# Patient Record
Sex: Female | Born: 1970 | Race: Black or African American | Hispanic: No | Marital: Single
Health system: Southern US, Community
[De-identification: ages and names within clinical notes are randomized; demographics above are authoritative.]

## PROBLEM LIST (undated history)

## (undated) DIAGNOSIS — I10 Essential (primary) hypertension: Secondary | ICD-10-CM

---

## 2017-05-01 ENCOUNTER — Emergency Department: Payer: Self-pay

## 2017-05-01 ENCOUNTER — Encounter: Payer: Self-pay | Admitting: Emergency Medicine

## 2017-05-01 ENCOUNTER — Emergency Department
Admission: EM | Admit: 2017-05-01 | Discharge: 2017-05-01 | Disposition: A | Discharge status: Home / Self Care | Payer: Self-pay | Attending: Emergency Medicine | Admitting: Emergency Medicine

## 2017-05-01 DIAGNOSIS — I10 Essential (primary) hypertension: Secondary | ICD-10-CM | POA: Insufficient documentation

## 2017-05-01 DIAGNOSIS — R0602 Shortness of breath: Secondary | ICD-10-CM | POA: Insufficient documentation

## 2017-05-01 DIAGNOSIS — D509 Iron deficiency anemia, unspecified: Secondary | ICD-10-CM | POA: Insufficient documentation

## 2017-05-01 DIAGNOSIS — R140 Abdominal distension (gaseous): Secondary | ICD-10-CM

## 2017-05-01 DIAGNOSIS — K219 Gastro-esophageal reflux disease without esophagitis: Secondary | ICD-10-CM | POA: Insufficient documentation

## 2017-05-01 HISTORY — DX: Essential (primary) hypertension: I10

## 2017-05-01 LAB — PREGNANCY, URINE: PREG TEST UR: NEGATIVE

## 2017-05-01 LAB — BASIC METABOLIC PANEL
Anion gap: 9 (ref 5–15)
BUN: 11 mg/dL (ref 6–20)
CALCIUM: 9.4 mg/dL (ref 8.9–10.3)
CO2: 23 mmol/L (ref 22–32)
CREATININE: 0.94 mg/dL (ref 0.44–1.00)
Chloride: 103 mmol/L (ref 101–111)
GFR calc Af Amer: >60 mL/min (ref >60)
Glucose, Bld: 133 mg/dL — ABNORMAL HIGH (ref 65–99)
Potassium: 3.7 mmol/L (ref 3.5–5.1)
Sodium: 135 mmol/L (ref 135–145)

## 2017-05-01 LAB — CBC
HCT: 27 % — ABNORMAL LOW (ref 35.0–47.0)
Hemoglobin: 8.6 g/dL — ABNORMAL LOW (ref 12.0–16.0)
MCH: 20.2 pg — ABNORMAL LOW (ref 26.0–34.0)
MCHC: 31.8 g/dL — ABNORMAL LOW (ref 32.0–36.0)
MCV: 63.6 fL — ABNORMAL LOW (ref 80.0–100.0)
PLATELETS: 372 10*3/uL (ref 150–440)
RBC: 4.25 MIL/uL (ref 3.80–5.20)
RDW: 20.9 % — AB (ref 11.5–14.5)
WBC: 8 10*3/uL (ref 3.6–11.0)

## 2017-05-01 LAB — TROPONIN I: Troponin I: <0.03 ng/mL (ref <0.03)

## 2017-05-01 MED ORDER — FAMOTIDINE 20 MG PO TABS
40.0000 mg | ORAL_TABLET | Freq: Once | ORAL | Status: AC
  Administered 2017-05-01: 40 mg via ORAL
  Filled 2017-05-01: qty 2

## 2017-05-01 MED ORDER — GI COCKTAIL ~~LOC~~
30.0000 mL | ORAL | Status: AC
  Administered 2017-05-01: 30 mL via ORAL
  Filled 2017-05-01: qty 30

## 2017-05-01 MED ORDER — ALUMINUM-MAGNESIUM-SIMETHICONE 200-200-20 MG/5ML PO SUSP: 30.0000 mL | Freq: Three times a day (TID) | ORAL | 0 refills | Status: AC

## 2017-05-01 MED ORDER — FERROUS SULFATE 325 (65 FE) MG PO TABS: 325.0000 mg | ORAL_TABLET | Freq: Every day | ORAL | 3 refills | Status: AC

## 2017-05-01 MED ORDER — LISINOPRIL-HYDROCHLOROTHIAZIDE 20-12.5 MG PO TABS: 1.0000 | ORAL_TABLET | Freq: Every day | ORAL | 1 refills | Status: DC

## 2017-05-01 MED ORDER — FAMOTIDINE 20 MG PO TABS: 20.0000 mg | ORAL_TABLET | Freq: Two times a day (BID) | ORAL | 0 refills | Status: AC

## 2017-05-01 MED ORDER — LORATADINE 10 MG PO TABS: 10.0000 mg | ORAL_TABLET | Freq: Every day | ORAL | 2 refills | Status: AC

## 2017-05-01 NOTE — ED Notes (Signed)
Patient transported to X-ray 

## 2017-05-01 NOTE — ED Notes (Signed)
Pt states she had immediate relief from the sensation in her throat and stomach.

## 2017-05-01 NOTE — ED Triage Notes (Signed)
Pt reports abdominal bloating, shortness of breath, tightness in upper chest area and palpitations. Pt ambulatory to triage, no resp distress noted.

## 2017-05-01 NOTE — ED Notes (Signed)
Urine sent without completing POC urine, lab notified.

## 2017-05-01 NOTE — ED Provider Notes (Signed)
[facility] Emergency Department Provider Note  ____________________________________________  Time seen: Approximately 1:01 PM  I have reviewed the triage vital signs and the nursing notes.   HISTORY  Chief Complaint Shortness of Breath; Chest Pain; and Bloated    HPI Sierra Steele is a 47 y.o. female who complains of a bloating feeling for the past week with epigastric pain. Worse when lying flat at night. She wakes up in the middle of night often having to cough. Associated with shortness of breath. She also notes having a postnasal drip that started after recently moving to this region. Denies chest pain, denies any significant shortness of breath. No vomiting. No fever chills sweats or radiating pain. Symptoms are mild to moderate intensity. No alleviating factors.      Past Medical History:  Diagnosis Date  . Hypertension      There are no active problems to display for this patient.    History reviewed. No pertinent surgical history.   Prior to Admission medications   Medication Sig Start Date End Date Taking? Authorizing Provider  aluminum-magnesium hydroxide-simethicone (MAALOX) 200-200-20 MG/5ML SUSP Take 30 mLs by mouth 4 (four) times daily -  before meals and at bedtime. 05/01/17   Sharman Cheek, MD  famotidine (PEPCID) 20 MG tablet Take 1 tablet (20 mg total) by mouth 2 (two) times daily. 05/01/17   Sharman Cheek, MD  ferrous sulfate 325 (65 FE) MG tablet Take 1 tablet (325 mg total) by mouth daily. 05/01/17 05/01/18  Sharman Cheek, MD  lisinopril-hydrochlorothiazide (ZESTORETIC) 20-12.5 MG tablet Take 1 tablet by mouth daily. 05/01/17 05/01/18  Sharman Cheek, MD  loratadine (CLARITIN) 10 MG tablet Take 1 tablet (10 mg total) by mouth daily. 05/01/17 05/01/18  Sharman Cheek, MD     Allergies Patient has no known allergies.   No family history on file.  Social History Social History   Tobacco Use  . Smoking status:  Not on file  Substance Use Topics  . Alcohol use: Not on file  . Drug use: Not on file    Review of Systems  Constitutional:   No fever or chills.  ENT:   No sore throat. Positive rhinorrhea. Cardiovascular:   No chest pain or syncope. Respiratory:   No dyspnea or cough. Gastrointestinal:  positive as above for upper abdominal pain without.  Musculoskeletal:   Negative for focal pain or swelling All other systems reviewed and are negative except as documented above in ROS and HPI.  ____________________________________________   PHYSICAL EXAM:  VITAL SIGNS: ED Triage Vitals  Enc Vitals Group     BP 05/01/17 0841 (!) 175/100     Pulse Rate 05/01/17 0841 (!) 105     Resp 05/01/17 0841 17     Temp 05/01/17 0841 98.3 F (36.8 C)     Temp Source 05/01/17 0841 Oral     SpO2 05/01/17 0841 100 %     Weight 05/01/17 0842 196 lb (88.9 kg)     Height 05/01/17 0842 5' (1.524 m)     Head Circumference --      Peak Flow --      Pain Score 05/01/17 0842 0     Pain Loc --      Pain Edu? --      Excl. in GC? --     Vital signs reviewed, nursing assessments reviewed.   Constitutional:   Alert and oriented. Well appearing and in no distress. Eyes:   Conjunctivae are normal. EOMI. PERRL. ENT  Head:   Normocephalic and atraumatic.      Nose:   No congestion/rhinnorhea.       Mouth/Throat:   MMM, mild pharyngeal erythema. No peritonsillar mass.       Neck:   No meningismus. Full ROM.no JVD Hematological/Lymphatic/Immunilogical:   No cervical lymphadenopathy. Cardiovascular:   RRR rate of 80. Symmetric bilateral radial and DP pulses.  No murmurs.  Respiratory:   Normal respiratory effort without tachypnea/retractions. Breath sounds are clear and equal bilaterally. No wheezes/rales/rhonchi. Gastrointestinal:   Soft and nontender. Non distended. There is no CVA tenderness.  No rebound, rigidity, or guarding. Genitourinary:   deferred Musculoskeletal:   Normal range of motion in all  extremities. No joint effusions.  No lower extremity tenderness.  No edema. Neurologic:   Normal speech and language.  Motor grossly intact. No acute focal neurologic deficits are appreciated.  Skin:    Skin is warm, dry and intact. No rash noted.  No petechiae, purpura, or bullae.  ____________________________________________    LABS (pertinent positives/negatives) (all labs ordered are listed, but only abnormal results are displayed) Labs Reviewed  BASIC METABOLIC PANEL - Abnormal; Notable for the following components:      Result Value   Glucose, Bld 133 (*)    All other components within normal limits  CBC - Abnormal; Notable for the following components:   Hemoglobin 8.6 (*)    HCT 27.0 (*)    MCV 63.6 (*)    MCH 20.2 (*)    MCHC 31.8 (*)    RDW 20.9 (*)    All other components within normal limits  TROPONIN I  PREGNANCY, URINE  POC URINE PREG, ED   ____________________________________________   EKG  interpreted by me Sinus tachycardia rate 105, right axis, normal intervals. Normal QRS ST segments.  ____________________________________________    RADIOLOGY  Dg Chest 2 View  Result Date: 05/01/2017 CLINICAL DATA:  Abdominal bloating, shortness of breath, upper chest tightness, and palpitations intermittently for several weeks. Symptoms were worse this morning. No respiratory distress. EXAM: CHEST - 2 VIEW COMPARISON:  None. FINDINGS: The lungs are adequately inflated. The interstitial markings are mildly prominent. The heart is normal in size. The pulmonary vascularity is not engorged. There is no pleural effusion. The bony thorax exhibits no acute abnormality. IMPRESSION: Mild interstitial prominence may reflect bronchitic changes or smoking related changes if there is a history of smoking. There is no alveolar pneumonia nor CHF. Electronically Signed   By: David  Swazil[place] M.D.   On: 05/01/2017 08:57     ____________________________________________   PROCEDURES Procedures  ____________________________________________  DIFFERENTIAL DIAGNOSIS   Gerd, gastritis, seasonal allergies.  CLINICAL IMPRESSION / ASSESSMENT AND PLAN / ED COURSE  Pertinent labs & imaging results that were available during my care of the patient were reviewed by me and considered in my medical decision making (see chart for details).    patient well-appearing no acute distress, presents with abdominal bloating and symptoms consistent with GERD or postnasal drip. No significant shortness of breath except for when she is having is aspiration related to the other symptoms. No chest pain.Considering the patient's symptoms, medical history, and physical examination today, I have low suspicion for ACS, PE, TAD, pneumothorax, carditis, mediastinitis, pneumonia, CHF, or sepsis.    Clinical Course as of May 01 1301  Tue May 01, 2017  0944 Hb 9.2 12/2016. CBC c/w iron deficiency anemia.   Hemoglobin(!): 8.6 [PS]    Clinical Course User Index [PS] Sharman CheekStafford, Millicent Blazejewski, MD     -----------------------------------------  1:03 PM on 05/01/2017 -----------------------------------------  Continues feeling better. Vital signs unremarkable except for hypertension. I will refill the patient's lisinopril and he was has run out of. Stable for discharge home.  ____________________________________________   FINAL CLINICAL IMPRESSION(S) / ED DIAGNOSES    Final diagnoses:  Gastroesophageal reflux disease, esophagitis presence not specified  Bloating symptom  Iron deficiency anemia, unspecified iron deficiency anemia type     ED Discharge Orders        Ordered    lisinopril-hydrochlorothiazide (ZESTORETIC) 20-12.5 MG tablet  Daily     05/01/17 1301    aluminum-magnesium hydroxide-simethicone (MAALOX) 200-200-20 MG/5ML SUSP  3 times daily before meals & bedtime     05/01/17 1301    famotidine (PEPCID) 20 MG tablet   2 times daily     05/01/17 1301    loratadine (CLARITIN) 10 MG tablet  Daily     05/01/17 1301    ferrous sulfate 325 (65 FE) MG tablet  Daily     05/01/17 1303      Portions of this note were generated with dragon dictation software. Dictation errors may occur despite best attempts at proofreading.    Sharman Cheek, MD 05/01/17 [phone]

## 2017-05-01 NOTE — ED Notes (Signed)
[place] here for work, states for the past week she has had a nonproductive cough with "heart pain" and post nasal drip. Pt also c/o epigastric pain with bloating, states she feels like she is full of gas and needs to belch all the time. Pt is in NAD respirations WNL.. Skin is warm and dry..Marland Kitchen

## 2018-07-14 ENCOUNTER — Emergency Department (HOSPITAL_COMMUNITY): Payer: Self-pay

## 2018-07-14 ENCOUNTER — Emergency Department (HOSPITAL_COMMUNITY)
Admission: EM | Admit: 2018-07-14 | Discharge: 2018-07-14 | Disposition: A | Discharge status: Home / Self Care | Payer: Self-pay | Attending: Emergency Medicine | Admitting: Emergency Medicine

## 2018-07-14 ENCOUNTER — Encounter (HOSPITAL_COMMUNITY): Payer: Self-pay | Admitting: Emergency Medicine

## 2018-07-14 ENCOUNTER — Other Ambulatory Visit: Payer: Self-pay

## 2018-07-14 DIAGNOSIS — R0602 Shortness of breath: Secondary | ICD-10-CM | POA: Insufficient documentation

## 2018-07-14 DIAGNOSIS — Z79899 Other long term (current) drug therapy: Secondary | ICD-10-CM | POA: Insufficient documentation

## 2018-07-14 DIAGNOSIS — R609 Edema, unspecified: Secondary | ICD-10-CM | POA: Insufficient documentation

## 2018-07-14 DIAGNOSIS — R140 Abdominal distension (gaseous): Secondary | ICD-10-CM | POA: Insufficient documentation

## 2018-07-14 DIAGNOSIS — Z9114 Patient's other noncompliance with medication regimen: Secondary | ICD-10-CM | POA: Insufficient documentation

## 2018-07-14 DIAGNOSIS — I10 Essential (primary) hypertension: Secondary | ICD-10-CM | POA: Insufficient documentation

## 2018-07-14 DIAGNOSIS — F17210 Nicotine dependence, cigarettes, uncomplicated: Secondary | ICD-10-CM | POA: Insufficient documentation

## 2018-07-14 LAB — CBC WITH DIFFERENTIAL/PLATELET
Abs Immature Granulocytes: 0 10*3/uL (ref 0.00–0.07)
Basophils Absolute: 0 10*3/uL (ref 0.0–0.1)
Basophils Relative: 0 %
Eosinophils Absolute: 0.4 10*3/uL (ref 0.0–0.5)
Eosinophils Relative: 5 %
HCT: 28.5 % — ABNORMAL LOW (ref 36.0–46.0)
Hemoglobin: 8.1 g/dL — ABNORMAL LOW (ref 12.0–15.0)
Lymphocytes Relative: 19 %
Lymphs Abs: 1.3 10*3/uL (ref 0.7–4.0)
MCH: 19.2 pg — ABNORMAL LOW (ref 26.0–34.0)
MCHC: 28.4 g/dL — ABNORMAL LOW (ref 30.0–36.0)
MCV: 67.5 fL — ABNORMAL LOW (ref 80.0–100.0)
Monocytes Absolute: 0.2 10*3/uL (ref 0.1–1.0)
Monocytes Relative: 3 %
Neutro Abs: 5.1 10*3/uL (ref 1.7–7.7)
Neutrophils Relative %: 73 %
Platelets: 337 10*3/uL (ref 150–400)
RBC: 4.22 MIL/uL (ref 3.87–5.11)
RDW: 20.5 % — ABNORMAL HIGH (ref 11.5–15.5)
WBC: 7 10*3/uL (ref 4.0–10.5)
nRBC: 0 % (ref 0.0–0.2)
nRBC: 0 /100 WBC

## 2018-07-14 LAB — COMPREHENSIVE METABOLIC PANEL
ALT: 18 U/L (ref 0–44)
AST: 16 U/L (ref 15–41)
Albumin: 3.6 g/dL (ref 3.5–5.0)
Alkaline Phosphatase: 66 U/L (ref 38–126)
Anion gap: 10 (ref 5–15)
BUN: 14 mg/dL (ref 6–20)
CO2: 22 mmol/L (ref 22–32)
Calcium: 9.4 mg/dL (ref 8.9–10.3)
Chloride: 104 mmol/L (ref 98–111)
Creatinine, Ser: 0.91 mg/dL (ref 0.44–1.00)
GFR calc Af Amer: >60 mL/min (ref >60)
GFR calc non Af Amer: >60 mL/min (ref >60)
Glucose, Bld: 131 mg/dL — ABNORMAL HIGH (ref 70–99)
Potassium: 3.6 mmol/L (ref 3.5–5.1)
Sodium: 136 mmol/L (ref 135–145)
Total Bilirubin: 0.4 mg/dL (ref 0.3–1.2)
Total Protein: 7.4 g/dL (ref 6.5–8.1)

## 2018-07-14 LAB — BRAIN NATRIURETIC PEPTIDE: B Natriuretic Peptide: 29.5 pg/mL (ref 0.0–100.0)

## 2018-07-14 LAB — I-STAT BETA HCG BLOOD, ED (MC, WL, AP ONLY): I-stat hCG, quantitative: <5 m[IU]/mL (ref <5)

## 2018-07-14 LAB — TROPONIN I (HIGH SENSITIVITY)
Troponin I (High Sensitivity): 12 ng/L (ref <18)
Troponin I (High Sensitivity): 12 ng/L (ref <18)

## 2018-07-14 MED ORDER — HYDROCHLOROTHIAZIDE 25 MG PO TABS
25.0000 mg | ORAL_TABLET | Freq: Every day | ORAL | Status: DC
  Administered 2018-07-14: 25 mg via ORAL
  Filled 2018-07-14: qty 1

## 2018-07-14 MED ORDER — LISINOPRIL-HYDROCHLOROTHIAZIDE 20-25 MG PO TABS: 1.0000 | ORAL_TABLET | Freq: Every day | ORAL | 0 refills | Status: AC

## 2018-07-14 MED ORDER — LISINOPRIL 20 MG PO TABS
20.0000 mg | ORAL_TABLET | Freq: Once | ORAL | Status: AC
  Administered 2018-07-14: 20 mg via ORAL
  Filled 2018-07-14: qty 1

## 2018-07-14 NOTE — ED Notes (Signed)
Patient transported to X-ray 

## 2018-07-14 NOTE — ED Triage Notes (Signed)
Pt here for eval of left leg swelling for several days. States both legs swell occasionally. Pt has HTN, not currently taking any medications for this. Pt also reports right arm pain for 4 days without injury. No pain to calf or behind the knee.

## 2018-07-14 NOTE — ED Notes (Addendum)
Ambulated pt. Pt's O2 was between 99%-100%. Pt's pulse was 96-97.

## 2018-07-14 NOTE — ED Notes (Signed)
Pt ambulated with steady gait to the bathroom.

## 2018-07-14 NOTE — ED Provider Notes (Addendum)
[facility] Provider Note   CSN: [id number] Arrival date & time: 07/14/18  0756    History   Chief Complaint No chief complaint on file.   HPI Sierra BureauLajuana Steele is a 48 y.o. female with history of hypertension presents for evaluation of gradual onset, progressively worsening lower extremity edema, abdominal distention, and shortness of breath for around 1 month.  She reports lower extremity edema is bilateral, worse on the left.  It improves with elevation.  She reports dyspnea on exertion and feels as though her abdomen is protuberant.  Denies orthopnea or PND. She denies fevers, chills, chest pain, abdominal pain, nausea, vomiting, urinary symptoms, numbness or weakness.  She has a history of hypertension but has not been taking any medication for this in at least 3 months.  She reports that she previously was on lisinopril and a diuretic.  She denies recent travel or surgeries, no hemoptysis, no prior history of DVT or PE, she is not on OCPs.  She is a current smoker of approximately half a pack of cigarettes daily, denies recreational drug use or excessive alcohol intake.     The history is provided by the patient.    Past Medical History:  Diagnosis Date   Hypertension     There are no active problems to display for this patient.   History reviewed. No pertinent surgical history.   OB History   No obstetric history on file.      Home Medications    Prior to Admission medications   Medication Sig Start Date End Date Taking? Authorizing Provider  aluminum-magnesium hydroxide-simethicone (MAALOX) 200-200-20 MG/5ML SUSP Take 30 mLs by mouth 4 (four) times daily -  before meals and at bedtime. 05/01/17   Sharman CheekStafford, Phillip, MD  famotidine (PEPCID) 20 MG tablet Take 1 tablet (20 mg total) by mouth 2 (two) times daily. 05/01/17   Sharman CheekStafford, Phillip, MD  ferrous sulfate 325 (65 FE) MG tablet Take 1 tablet (325 mg total) by mouth daily. 05/01/17  05/01/18  Sharman CheekStafford, Phillip, MD  lisinopril-hydrochlorothiazide (ZESTORETIC) 20-25 MG tablet Take 1 tablet by mouth daily. 07/14/18   Ahmari Garton A, PA-C  loratadine (CLARITIN) 10 MG tablet Take 1 tablet (10 mg total) by mouth daily. 05/01/17 05/01/18  Sharman CheekStafford, Phillip, MD    Family History No family history on file.  Social History Social History   Tobacco Use   Smoking status: Not on file  Substance Use Topics   Alcohol use: Not on file   Drug use: Not on file     Allergies   Patient has no known allergies.   Review of Systems Review of Systems  Constitutional: Negative for chills and fever.  Respiratory: Positive for shortness of breath.   Cardiovascular: Positive for leg swelling. Negative for chest pain.  Gastrointestinal: Positive for abdominal distention. Negative for abdominal pain, constipation, diarrhea, nausea and vomiting.  All other systems reviewed and are negative.    Physical Exam Updated Vital Signs BP 132/80 (BP Location: Left Arm)    Pulse 75    Temp 98.1 F (36.7 C) (Oral)    Resp 19    LMP 06/14/2018    SpO2 100%   Physical Exam Vitals signs and nursing note reviewed.  Constitutional:      General: She is not in acute distress.    Appearance: She is well-developed.  HENT:     Head: Normocephalic and atraumatic.  Eyes:     General:  Right eye: No discharge.        Left eye: No discharge.     Conjunctiva/sclera: Conjunctivae normal.  Neck:     Musculoskeletal: Normal range of motion and neck supple.     Vascular: No JVD.     Trachea: No tracheal deviation.  Cardiovascular:     Rate and Rhythm: Normal rate and regular rhythm.     Pulses: Normal pulses.     Heart sounds: Normal heart sounds.     Comments: 2+ radial and DP/PT pulses bilaterally.  Homans sign absent bilaterally.  She has 1+ pitting edema of the bilateral lower extremities.  Calves and ankles measure symmetrically bilaterally Pulmonary:     Effort: Pulmonary effort is  normal.     Breath sounds: Normal breath sounds.     Comments: Speaking in full sentences without difficulty SPO2 saturations 100% on room air Abdominal:     General: Abdomen is protuberant. There is no distension.     Palpations: Abdomen is soft.     Tenderness: There is no abdominal tenderness. There is no right CVA tenderness, left CVA tenderness, guarding or rebound.  Skin:    General: Skin is warm and dry.     Findings: No erythema.  Neurological:     Mental Status: She is alert.  Psychiatric:        Behavior: Behavior normal.      ED Treatments / Results  Labs (all labs ordered are listed, but only abnormal results are displayed) Labs Reviewed  COMPREHENSIVE METABOLIC PANEL - Abnormal; Notable for the following components:      Result Value   Glucose, Bld 131 (*)    All other components within normal limits  CBC WITH DIFFERENTIAL/PLATELET - Abnormal; Notable for the following components:   Hemoglobin 8.1 (*)    HCT 28.5 (*)    MCV 67.5 (*)    MCH 19.2 (*)    MCHC 28.4 (*)    RDW 20.5 (*)    All other components within normal limits  BRAIN NATRIURETIC PEPTIDE  TROPONIN I (HIGH SENSITIVITY)  TROPONIN I (HIGH SENSITIVITY)  I-STAT BETA HCG BLOOD, ED ([facility], [facility], [facility] ONLY)    EKG EKG Interpretation  Date/Time:  Sunday July 14 2018 09:16:46 EDT Ventricular Rate:  85 PR Interval:    QRS Duration: 88 QT Interval:  388 QTC Calculation: 462 R Axis:   105 Text Interpretation:  Sinus rhythm Right axis deviation Baseline wander TECHNICALLY DIFFICULT No significant change since last tracing Confirmed by Melene PlanFloyd, Dan [phone]([id number]) on 07/14/2018 10:04:56 AM   Radiology Dg Chest 2 View  Result Date: 07/14/2018 CLINICAL DATA:  Shortness of breath. EXAM: CHEST - 2 VIEW COMPARISON:  Radiographs of May 01, 2017. FINDINGS: The heart size and mediastinal contours are within normal limits. Both lungs are clear. No pneumothorax or pleural effusion is noted. The visualized skeletal structures  are unremarkable. IMPRESSION: No active cardiopulmonary disease. Electronically Signed   By: Lupita RaiderJames  Green Jr M.D.   On: 07/14/2018 09:34    Procedures Procedures (including critical care time)  Medications Ordered in ED Medications  hydrochlorothiazide (HYDRODIURIL) tablet 25 mg (25 mg Oral Given 07/14/18 1014)  lisinopril (ZESTRIL) tablet 20 mg (20 mg Oral Given 07/14/18 0904)     Initial Impression / Assessment and Plan / ED Course  I have reviewed the triage vital signs and the nursing notes.  Pertinent labs & imaging results that were available during my care of the patient were reviewed by me and  considered in my medical decision making (see chart for details).        Patient presenting for evaluation of bilateral lower extremity edema, some abdominal protuberance/bloating sensation, and dyspnea on exertion.  Symptoms have been ongoing for 1 month.  She is afebrile, hypertensive in the ED but not compliant with her hypertension medications and has not had them refilled in over 3 months.  She is nontoxic in appearance.  Normal neurologic examination, no chest pain.  Will obtain EKG and blood work and assess for CHF or other cardiopulmonary abnormality.  EKG shows normal sinus rhythm, no significant changes from last tracing, no acute ischemic abnormalities.  Initial troponin is negative and she has no complaint of chest pain and I doubt ACS/MI.  Chest x-ray shows no acute cardiopulmonary abnormalities, no evidence of cardiomegaly or pleural effusion.  Remainder blood work reviewed by me shows no leukocytosis, anemia with hemoglobin of 8.1, appears to be at baseline.  No metabolic derangements, no renal insufficiency.  BNP is within normal limits.  She was ambulated in the ED with stable SPO2 saturations, normal heart rate.  No difficulty with ambulation.  No evidence of CHF or pneumonia.  Doubt PE, cardiac tamponade, esophageal rupture, or dissection.  Doubt DVT given peripheral edema is  bilateral, she has no lower extremity pain and Homans sign is absent bilaterally.  She has no risk factors for DVT or PE and is PERC negative.  On reevaluation patient resting comfortably no apparent distress.  She was given her home blood pressure medications in the ED with complete resolution of her hypertension.  I suspect that she is experiencing some peripheral edema due to prolonged standing, exacerbated by her hypertension.  No evidence of endorgan damage.  With improvement in her blood pressure and reassuring work-up I do not feel that she requires admission for hypertensive urgency or emergency.  I will refill her lisinopril-HCTZ which is on the $4 list at [facility], $10 for 8-month supply.  I have given her resources for follow-up with a PCP on an outpatient basis.  We discussed lifestyle modification, smoking cessation, and the importance of medication compliance.  Discussed strict ED return precautions. Patient verbalized understanding of and agreement with plan and is safe for discharge home at this time.    Final Clinical Impressions(s) / ED Diagnoses   Final diagnoses:  Peripheral edema    ED Discharge Orders         Ordered    lisinopril-hydrochlorothiazide (ZESTORETIC) 20-25 MG tablet  Daily     07/14/18 1134             Renita Papa, PA-C 07/14/18 [place], [place], DO 07/14/18 1523

## 2018-07-14 NOTE — Discharge Instructions (Signed)
Your work-up today was reassuring that you are not having a heart attack and do not have serious heart failure.  I have prescribed a 90-day supply of your blood pressure medication/fluid pill.  90 tablets should be about $10 at [facility] which is a 48-month supply.  Take your blood pressure medication daily.  Eat a heart healthy diet, avoid salty foods.  I would also recommend that you quit smoking.  When you are not walking, elevate your legs to help reduce swelling.  When you are at work or on your feet all day, I would recommend wearing compression stockings.  You can buy this at any pharmacy including the [facility] where your medication was sent to.  Follow-up with your primary care provider or cardiologist for reevaluation of your symptoms.  I have given you the information for [place] and wellness and primary care at [facility].  Call tomorrow to set up an appointment.  Tell them you referred from the emergency department.  Return to the emergency department if any concerning signs or symptoms develop such as chest pains, worsening shortness of breath, high fevers, persistent vomiting, or severe abdominal pain.

## 2020-12-17 IMAGING — CR CHEST - 2 VIEW
2 series · 2 of 2 positions shown · non-contrast
Comparison: Radiographs May 01, 2017.

CLINICAL DATA: Shortness of breath.

EXAM:
CHEST - 2 VIEW

[chest pa]
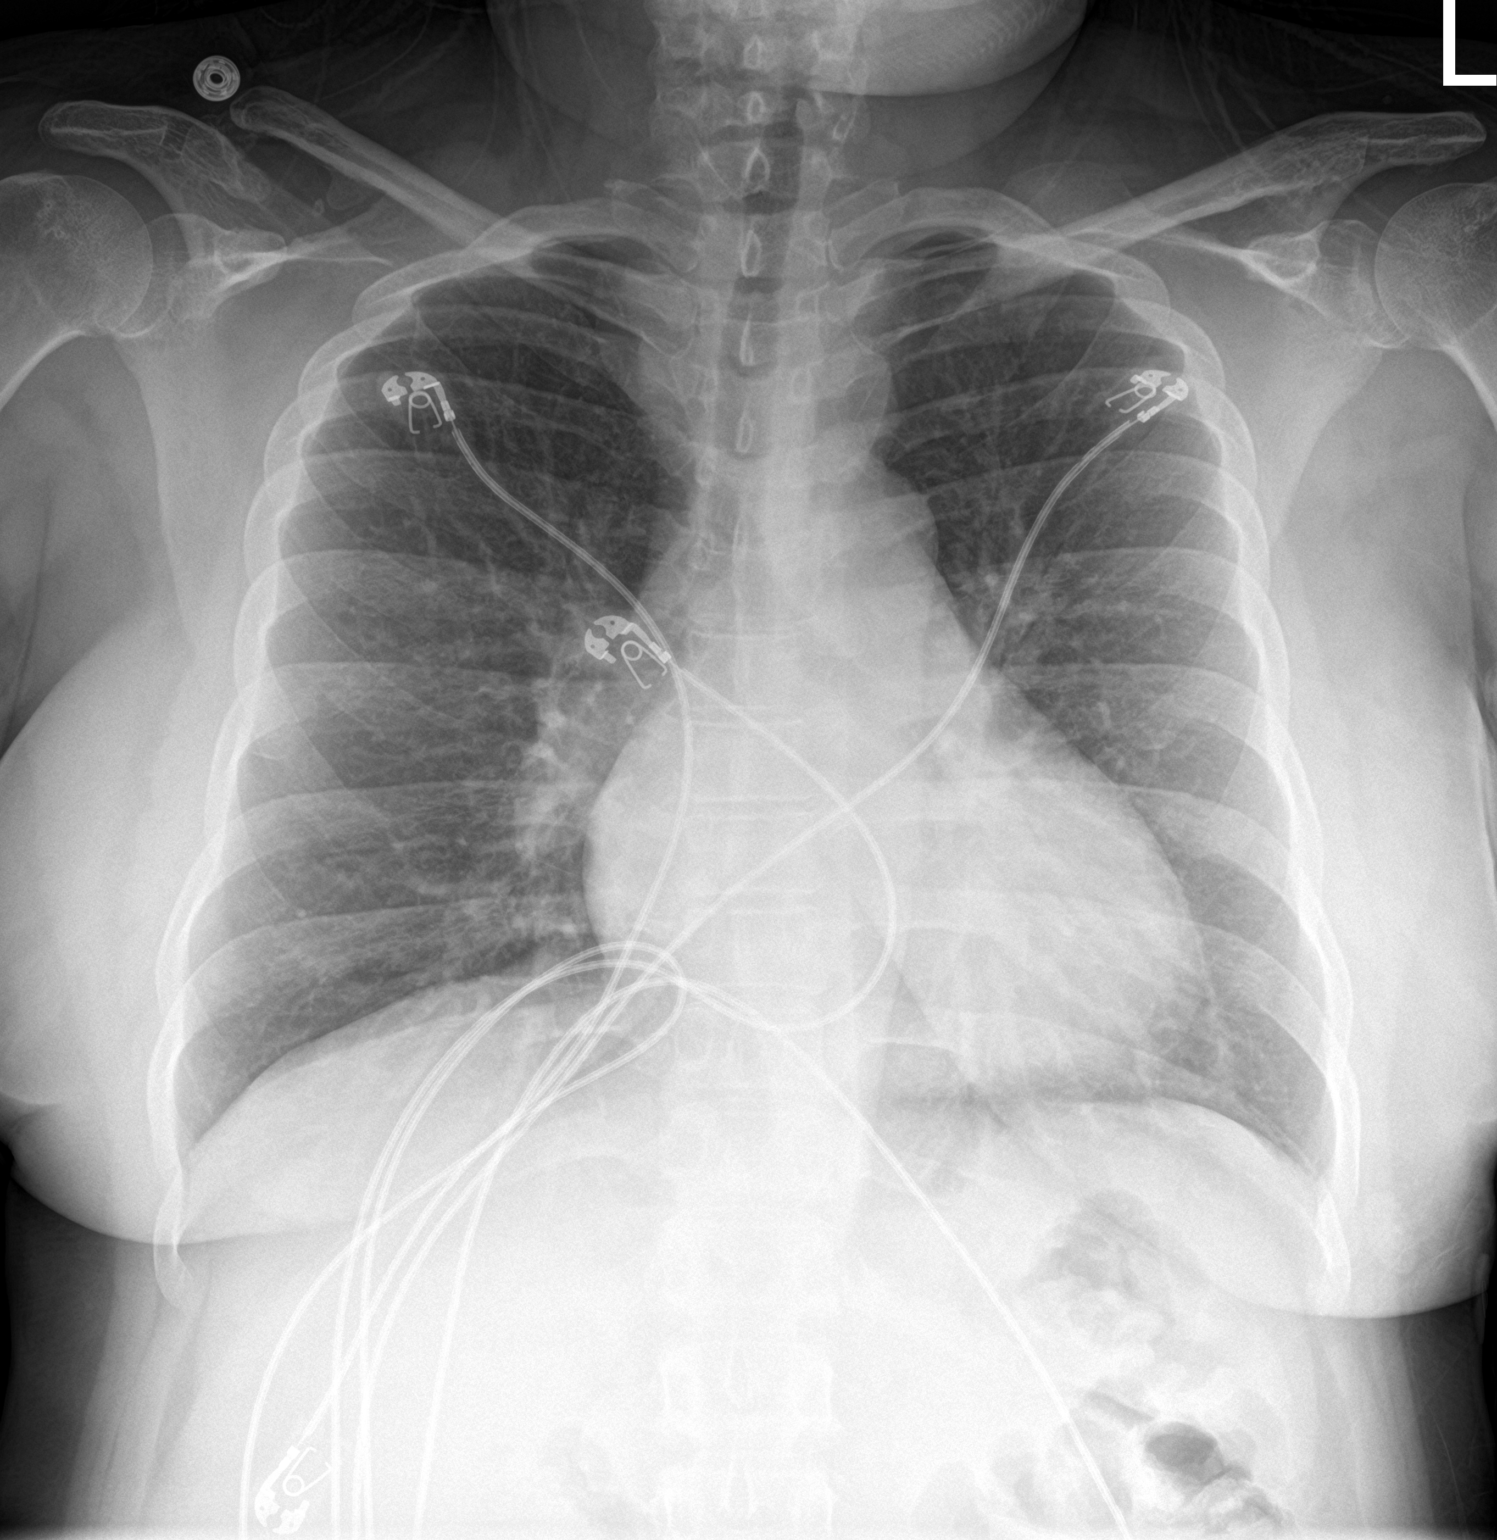

[chest lat]
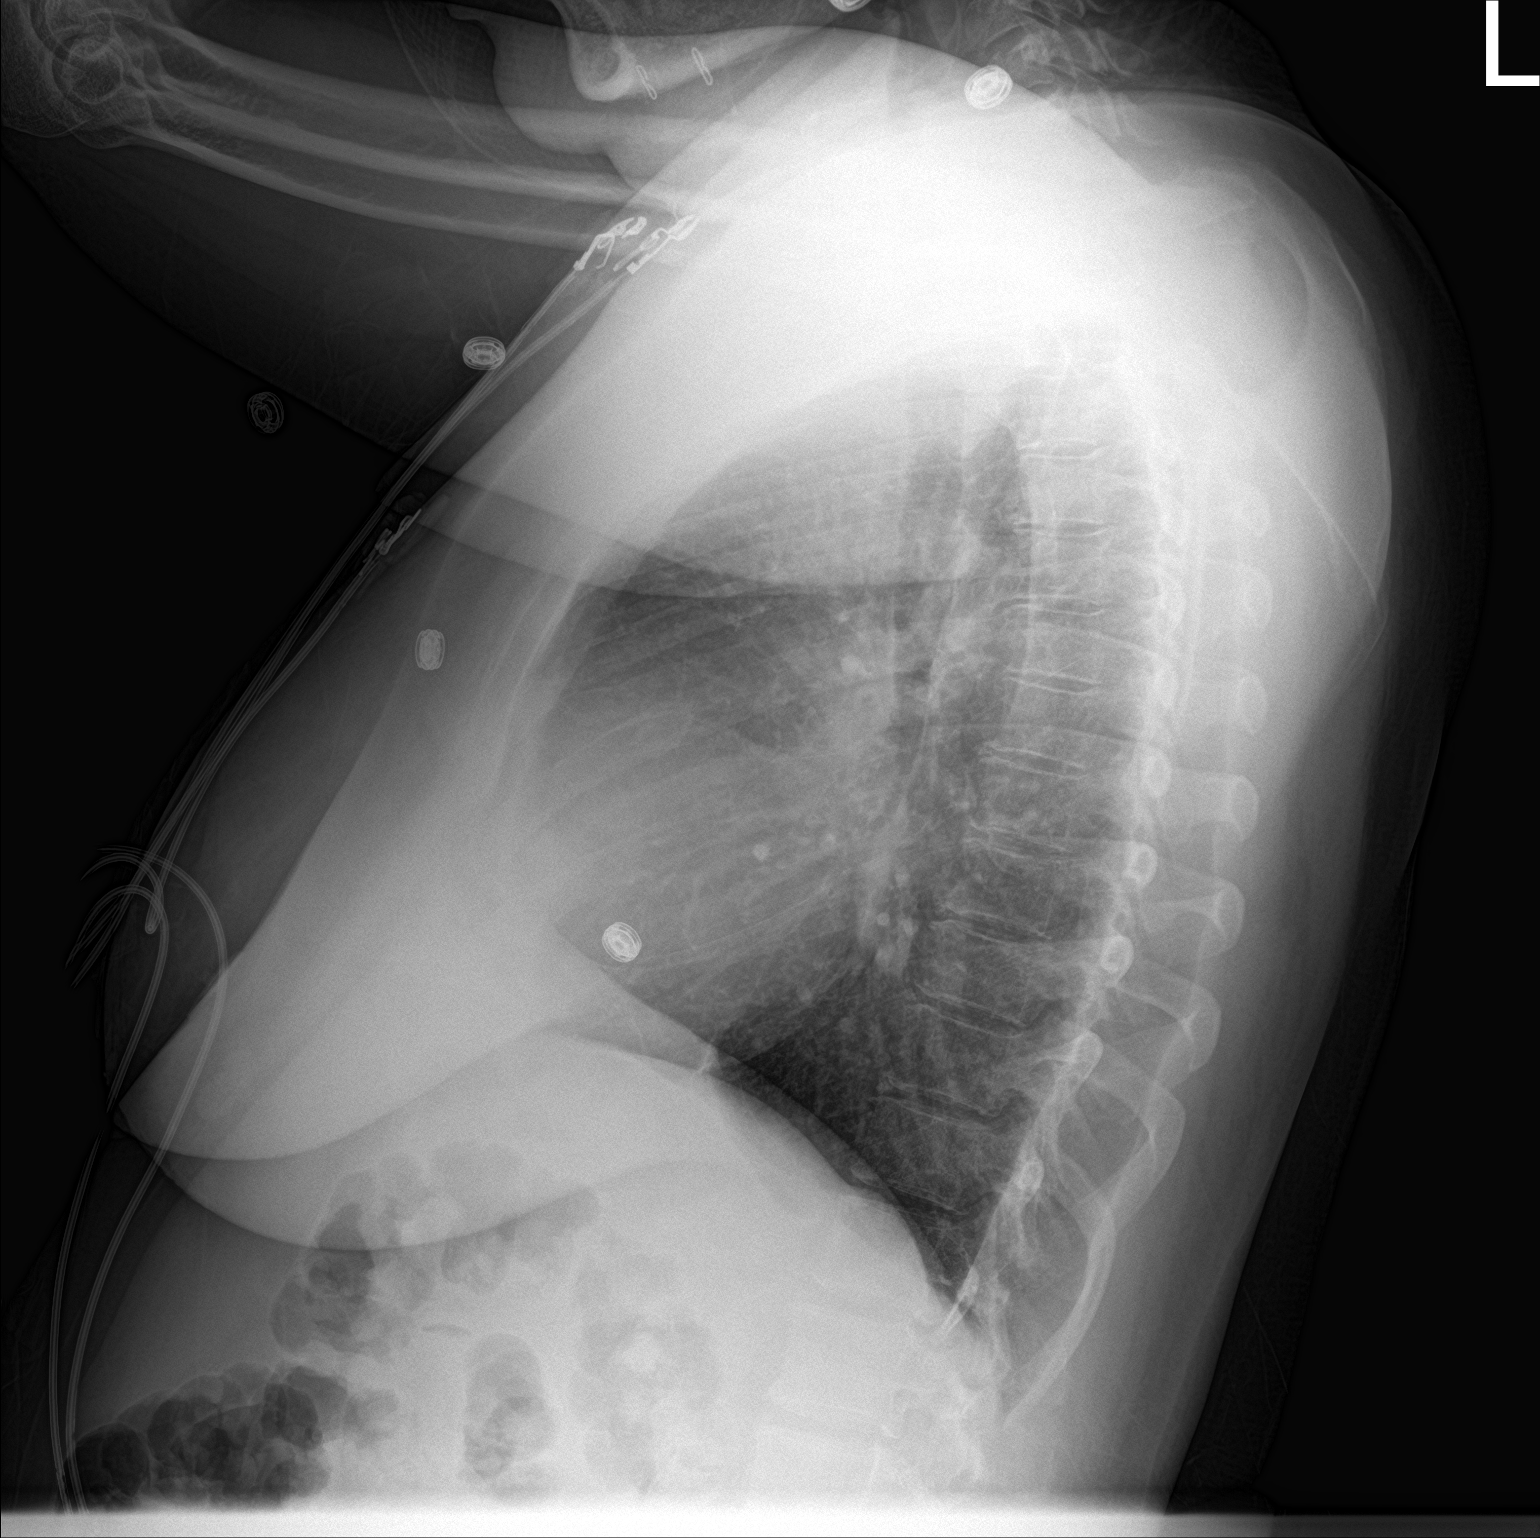

[2 of 2 positions shown; findings below may reference images not displayed]

FINDINGS: The heart size and mediastinal contours are within normal limits.
Both lungs are clear. No pneumothorax or pleural effusion is noted.
The visualized skeletal structures are unremarkable.
IMPRESSION: No active cardiopulmonary disease.
